# Patient Record
Sex: Female | Born: 1965 | Race: Black or African American | Hispanic: No | Marital: Single | Smoking: Never smoker
Health system: Southern US, Community
[De-identification: ages and names within clinical notes are randomized; demographics above are authoritative.]

## PROBLEM LIST (undated history)

## (undated) DIAGNOSIS — E785 Hyperlipidemia, unspecified: Secondary | ICD-10-CM

## (undated) DIAGNOSIS — I10 Essential (primary) hypertension: Secondary | ICD-10-CM

## (undated) DIAGNOSIS — M1990 Unspecified osteoarthritis, unspecified site: Secondary | ICD-10-CM

## (undated) HISTORY — DX: Hyperlipidemia, unspecified: E78.5

## (undated) HISTORY — DX: Unspecified osteoarthritis, unspecified site: M19.90

## (undated) HISTORY — DX: Essential (primary) hypertension: I10

---

## 2008-09-13 ENCOUNTER — Ambulatory Visit: Payer: Self-pay | Admitting: Family Medicine

## 2009-10-30 ENCOUNTER — Ambulatory Visit: Payer: Self-pay | Admitting: Family Medicine

## 2009-11-14 ENCOUNTER — Ambulatory Visit: Payer: Self-pay

## 2009-11-22 ENCOUNTER — Ambulatory Visit: Payer: Self-pay

## 2010-11-13 ENCOUNTER — Ambulatory Visit: Payer: Self-pay

## 2011-05-29 ENCOUNTER — Emergency Department: Payer: Self-pay

## 2011-05-29 LAB — PREGNANCY, URINE: Pregnancy Test, Urine: NEGATIVE m[IU]/mL

## 2011-05-29 LAB — URINALYSIS, COMPLETE
Bacteria: NONE SEEN
Bilirubin,UR: NEGATIVE
Blood: NEGATIVE
Glucose,UR: NEGATIVE mg/dL (ref 0–75)
Leukocyte Esterase: NEGATIVE
Nitrite: NEGATIVE
Protein: NEGATIVE
WBC UR: <1 /HPF (ref 0–5)

## 2012-01-20 ENCOUNTER — Ambulatory Visit: Payer: Self-pay | Admitting: Family Medicine

## 2013-04-12 ENCOUNTER — Ambulatory Visit: Payer: Self-pay

## 2013-05-13 HISTORY — PX: ABDOMINAL HYSTERECTOMY: SHX81

## 2014-05-25 ENCOUNTER — Ambulatory Visit: Payer: Self-pay

## 2014-06-06 ENCOUNTER — Ambulatory Visit: Payer: Self-pay

## 2014-06-08 ENCOUNTER — Encounter: Payer: Self-pay | Admitting: *Deleted

## 2014-06-20 ENCOUNTER — Encounter: Payer: Self-pay | Admitting: General Surgery

## 2014-06-20 ENCOUNTER — Ambulatory Visit (INDEPENDENT_AMBULATORY_CARE_PROVIDER_SITE_OTHER): Payer: PRIVATE HEALTH INSURANCE | Admitting: General Surgery

## 2014-06-20 VITALS — BP 120/80 | HR 78 | Resp 14 | Ht 61.0 in | Wt 151.0 lb

## 2014-06-20 DIAGNOSIS — N644 Mastodynia: Secondary | ICD-10-CM

## 2014-06-20 NOTE — Patient Instructions (Addendum)
Continue self breast exams. Call office for any new breast issues or concerns. Use ibuprofen twice a day for 10 days. Use heating pad 15 minutes twice a day. May try a sleep bra for comfort.

## 2014-06-20 NOTE — Progress Notes (Signed)
Patient ID: Beth Poole, female   DOB: 12/05/1965, 49 y.o.   MRN: [id number]  Chief Complaint  Patient presents with  . Follow-up    mammogram     HPI Beth Poole is a 49 y.o. female her for abnormal exam of breast. She was found to have thickening in the left breast upon exam. Her last mammogram was 06/06/14 [facility]. She does report some tenderness in the left breast that comes and goes in the last 1-2 months. She reports no other problems with her breasts.  HPI  Past Medical History  Diagnosis Date  . Arthritis   . Hyperlipidemia   . Hypertension     Past Surgical History  Procedure Laterality Date  . Abdominal hysterectomy  05/13/2013    still has ovaries    No family history on file.  Social History History  Substance Use Topics  . Smoking status: Never Smoker   . Smokeless tobacco: Never Used  . Alcohol Use: No    No Known Allergies  Current Outpatient Prescriptions  Medication Sig Dispense Refill  . hydrochlorothiazide (HYDRODIURIL) 25 MG tablet Take 25 mg by mouth daily.    Marland Kitchen. ibuprofen (ADVIL,MOTRIN) 800 MG tablet Take 800 mg by mouth every 8 (eight) hours as needed.     No current facility-administered medications for this visit.    Review of Systems Review of Systems  Constitutional: Negative.   Respiratory: Negative.   Cardiovascular: Negative.     Blood pressure 120/80, pulse 78, resp. rate 14, height 5\' 1"  (1.549 m), weight 151 lb (68.493 kg).  Physical Exam Physical Exam  Constitutional: She is oriented to person, place, and time. She appears well-developed and well-nourished.  Eyes: Conjunctivae are normal. No scleral icterus.  Neck: Neck supple.  Cardiovascular: Normal rate, regular rhythm and normal heart sounds.   Pulmonary/Chest: Effort normal and breath sounds normal. Right breast exhibits no inverted nipple, no mass, no nipple discharge, no skin change and no tenderness. Left breast exhibits tenderness (upper outer quadrant). Left breast  exhibits no inverted nipple, no mass, no nipple discharge and no skin change.    Lymphadenopathy:    She has no cervical adenopathy.    She has no axillary adenopathy.  Neurological: She is alert and oriented to person, place, and time.    Data Reviewed Bilateral mammograms dated 06/06/2014 were reviewed. Dense breasts as previously noted. Negative ultrasound. BI-RADS-1.  Assessment    Benign breast exam. Focal mastalgia at the axillary tail, possibly within the underlying pectoralis muscle.    Plan    The patient has made use of Motrin with good relief in the past. She was encouraged to make use of 800 mg twice a day for the next 10 days and make use of local heat to the area which should resolve her discomfort. Reassure the clinical exam, mammograms and ultrasounds are all in good order and there is no suspicion for occult malignancy.    PCP: Dr. Darreld McleanLinda Miles Ref: Corky CraftsSheena Lambert  Graciela Plato W 06/21/2014, 4:35 PM

## 2014-06-21 DIAGNOSIS — N644 Mastodynia: Secondary | ICD-10-CM | POA: Insufficient documentation

## 2014-08-01 ENCOUNTER — Encounter: Payer: Self-pay | Admitting: General Surgery

## 2015-06-21 ENCOUNTER — Ambulatory Visit: Payer: Self-pay | Attending: Oncology

## 2015-06-21 ENCOUNTER — Ambulatory Visit
Admission: RE | Admit: 2015-06-21 | Discharge: 2015-06-21 | Disposition: A | Discharge status: Home / Self Care | Payer: Self-pay | Source: Ambulatory Visit | Attending: Oncology | Admitting: Oncology

## 2015-06-21 VITALS — BP 122/79 | HR 80 | Temp 98.1°F | Resp 16 | Ht 62.21 in | Wt 144.6 lb

## 2015-06-21 DIAGNOSIS — Z0000 Encounter for general adult medical examination without abnormal findings: Secondary | ICD-10-CM

## 2015-06-21 NOTE — Progress Notes (Signed)
Subjective:     Patient ID: Beth Poole, female   DOB: 03/22/1965, 50 y.o.   MRN: [id number]  HPI   Review of Systems     Objective:   Physical Exam  Pulmonary/Chest: Right breast exhibits no inverted nipple, no mass, no nipple discharge, no skin change and no tenderness. Left breast exhibits no inverted nipple, no mass, no nipple discharge, no skin change and no tenderness. Breasts are symmetrical.  Genitourinary: No labial fusion. There is no rash, tenderness, lesion or injury on the right labia. There is no rash, tenderness, lesion or injury on the left labia. Right adnexum displays no mass, no tenderness and no fullness. Left adnexum displays no mass, no tenderness and no fullness. No erythema, tenderness or bleeding in the vagina. No foreign body around the vagina. No signs of injury around the vagina. No vaginal discharge found.  Partial hysterectomy.  Per patient ovaries intact.       Assessment:     50 year old patient presents for [facility] clinic visitPatient screened, and meets [facility] eligibility.  Patient does not have insurance, Medicare or Medicaid.  Handout given on Affordable Care Act. Instructed patient on breast self-exam using teach back method.  CBE unremarkable .  No mass or lump palpated.  Patient had a prtial hysterectomy in 2015.  Normal pap prior to surgery.  Pelvic exam normal.  No cervix visulized or palpated.    Plan:     Sent for bilateral screening mammogram.

## 2015-07-12 NOTE — Progress Notes (Unsigned)
Letter mailed from [facility] to notify of normal mammogram results.  Patient to return in one year for annual screening.  Copy to [facility]. 

## 2016-09-04 ENCOUNTER — Ambulatory Visit: Payer: Self-pay

## 2016-09-30 ENCOUNTER — Encounter (INDEPENDENT_AMBULATORY_CARE_PROVIDER_SITE_OTHER): Payer: Self-pay

## 2016-09-30 ENCOUNTER — Ambulatory Visit: Payer: Self-pay | Attending: Oncology

## 2016-09-30 ENCOUNTER — Ambulatory Visit
Admission: RE | Admit: 2016-09-30 | Discharge: 2016-09-30 | Disposition: A | Discharge status: Home / Self Care | Payer: Self-pay | Source: Ambulatory Visit | Attending: Oncology | Admitting: Oncology

## 2016-09-30 ENCOUNTER — Ambulatory Visit: Payer: Self-pay

## 2016-09-30 VITALS — BP 133/88 | HR 72 | Temp 98.5°F | Ht 61.0 in | Wt 156.0 lb

## 2016-09-30 DIAGNOSIS — Z0000 Encounter for general adult medical examination without abnormal findings: Secondary | ICD-10-CM

## 2016-09-30 NOTE — Progress Notes (Signed)
Subjective:     Patient ID: Georgina QuintGail R Sanger, female   DOB: 12/27/1965, 51 y.o.   MRN: [id number]  HPI   Review of Systems     Objective:   Physical Exam  Pulmonary/Chest: Right breast exhibits no inverted nipple, no mass, no nipple discharge, no skin change and no tenderness. Left breast exhibits no inverted nipple, no mass, no nipple discharge, no skin change and no tenderness. Breasts are symmetrical.       Assessment:     51 [facility].  Patient screened, and meets [facility] eligibility.  Patient does not have insurance, Medicare or Medicaid.  Handout given on Affordable Care Act. Patient works "As a Interior and spatial designerhairdresser near the [facility] Instructed patient on breast self-exam using teach back method.  CBE unremarkable.  No mass or lump palpated.  Reports she forgot to take her blood pressure medicine this morning, but verbalizes understanding, and rational for taking as prescribed at the same time daily.     Plan:     Sent for bilateral screening mammogram.

## 2016-11-27 NOTE — Progress Notes (Unsigned)
Letter mailed from [facility] to notify of normal mammogram results.  Patient to return in one year for annual screening.  Copy to [facility]. 

## 2017-10-01 ENCOUNTER — Encounter: Payer: Self-pay | Admitting: *Deleted

## 2017-10-01 ENCOUNTER — Ambulatory Visit: Payer: Self-pay | Attending: Oncology | Admitting: *Deleted

## 2017-10-01 ENCOUNTER — Ambulatory Visit
Admission: RE | Admit: 2017-10-01 | Discharge: 2017-10-01 | Disposition: A | Discharge status: Home / Self Care | Payer: Self-pay | Source: Ambulatory Visit | Attending: Oncology | Admitting: Oncology

## 2017-10-01 ENCOUNTER — Other Ambulatory Visit: Payer: Self-pay

## 2017-10-01 VITALS — BP 123/85 | HR 85 | Temp 96.0°F | Ht 62.0 in | Wt 156.0 lb

## 2017-10-01 DIAGNOSIS — Z0000 Encounter for general adult medical examination without abnormal findings: Secondary | ICD-10-CM

## 2017-10-01 NOTE — Progress Notes (Signed)
Letter mailed from [facility] to inform patient of her normal mammogram results.  Patient is to follow-up with annual screening in one year.  [facility] to [place]y. 

## 2017-10-01 NOTE — Progress Notes (Signed)
  Subjective:     Patient ID: Beth Poole, female   DOB: 10/05/1965, 52 y.o.   MRN: [id number]  HPI   Review of Systems     Objective:   Physical Exam  Pulmonary/Chest: Right breast exhibits no inverted nipple, no mass, no nipple discharge, no skin change and no tenderness. Left breast exhibits no inverted nipple, no mass, no nipple discharge, no skin change and no tenderness.       Assessment:     52 year old Black [facility]  Clinical breast exam unremarkable.  Taught self breast awareness.  Patient had a hysterectomy in 2015 for heavy bleeding.  No pap required per protocol.  Patient has been screened for eligibility.  She does not have any insurance, Medicare or Medicaid.  She also meets financial eligibility.  Hand-out given on the Affordable Care Act.    Plan:     Screening mammogram ordered.  Will follow-up per [facility] protocol.

## 2017-10-01 NOTE — Patient Instructions (Signed)
Gave patient hand-out, Women Staying Healthy, Active and Well from [facility], with education on breast health, pap smears, heart and colon health. 

## 2018-10-05 ENCOUNTER — Other Ambulatory Visit: Payer: Self-pay

## 2018-10-05 DIAGNOSIS — Z0000 Encounter for general adult medical examination without abnormal findings: Secondary | ICD-10-CM

## 2018-10-05 NOTE — Progress Notes (Signed)
Phoned patient for 10/07/2018 mammogram. Patient screened, and meets [facility] eligibility.  Patient does not have insurance, Medicare or Medicaid.  Verbal Consent given.  Patient denies any breast problems.  Orders placed for bilateral screening mammogram.  Patient to arrive at 10:30.

## 2018-10-07 ENCOUNTER — Ambulatory Visit
Admission: RE | Admit: 2018-10-07 | Discharge: 2018-10-07 | Disposition: A | Discharge status: Home / Self Care | Payer: Self-pay | Source: Ambulatory Visit | Attending: Oncology | Admitting: Oncology

## 2018-10-07 ENCOUNTER — Ambulatory Visit: Payer: Self-pay | Attending: Oncology

## 2018-10-07 DIAGNOSIS — Z0000 Encounter for general adult medical examination without abnormal findings: Secondary | ICD-10-CM | POA: Insufficient documentation

## 2018-11-17 NOTE — Progress Notes (Signed)
Letter mailed from [facility] to notify of normal mammogram results.  Patient to return in one year for annual screening.  Copy to [facility].   Risk Assessment    No risk assessment data for the current encounter   Risk Scores      10/05/2018   Last edited by: Theodore Demark, RN   5-year risk: 1.3 %   Lifetime risk: 8.2 %

## 2019-03-15 ENCOUNTER — Ambulatory Visit: Payer: HRSA Program | Attending: Internal Medicine

## 2019-03-15 DIAGNOSIS — Z20822 Contact with and (suspected) exposure to covid-19: Secondary | ICD-10-CM

## 2019-03-16 LAB — NOVEL CORONAVIRUS, NAA: SARS-CoV-2, NAA: NOT DETECTED

## 2019-10-12 ENCOUNTER — Ambulatory Visit
Admission: RE | Admit: 2019-10-12 | Discharge: 2019-10-12 | Disposition: A | Discharge status: Home / Self Care | Payer: Self-pay | Source: Ambulatory Visit | Attending: Oncology | Admitting: Oncology

## 2019-10-12 ENCOUNTER — Ambulatory Visit: Payer: Self-pay | Attending: Oncology | Admitting: *Deleted

## 2019-10-12 ENCOUNTER — Encounter: Payer: Self-pay | Admitting: *Deleted

## 2019-10-12 ENCOUNTER — Other Ambulatory Visit: Payer: Self-pay

## 2019-10-12 VITALS — BP 150/94 | HR 79 | Temp 97.8°F | Resp 20 | Ht 61.0 in | Wt 154.0 lb

## 2019-10-12 DIAGNOSIS — Z0000 Encounter for general adult medical examination without abnormal findings: Secondary | ICD-10-CM | POA: Insufficient documentation

## 2019-10-12 NOTE — Progress Notes (Signed)
  Subjective:     Patient ID: Beth Poole, female   DOB: 1965/10/20, 54 y.o.   MRN: [id number]  HPI   [facility] Medical History Record - 10/12/19 1052      Breast History   Screening cycle Rescreen    CBE Date 10/12/19    Provider (CBE) [facility]    Last Mammogram Annual    Last Mammogram Date 10/07/18    Recent Breast Symptoms None      Breast Cancer History   Breast Cancer History No personal or family history      Previous History of Breast Problems   Breast Surgery or Biopsy None    Breast Implants N/A    BSE Done Monthly      Gynecological/Obstetrical History   Is there any chance that the client could be pregnant?  No    Breast fed children No    DES Exposure No    Cervical, Uterine or Ovarian cancer No    Family history of Cervial, Uterine or Ovarian cancer No    Hysterectomy Yes    Cervix removed Yes    Ovaries removed Yes    Laser/Cryosurgery No    Current method of birth control None    Current method of Estrogen/Hormone replacement None    Smoking history None            Review of Systems     Objective:   Physical Exam Chest:     Breasts:        Right: No swelling, bleeding, inverted nipple, mass, nipple discharge, skin change or tenderness.        Left: No swelling, bleeding, inverted nipple, mass, nipple discharge, skin change or tenderness.  Lymphadenopathy:     Upper Body:     Right upper body: No supraclavicular or axillary adenopathy.     Left upper body: No supraclavicular or axillary adenopathy.        Assessment:     54 year old Black female returns to [facility] for annual screening.  Clinical breast exam unremarkable.  Taught self breast awareness.  Patient with a history of hysterectomy for heavy bleeding.  Pap omitted per protocol.  Patient has been screened for eligibility.  She does not have any insurance, Medicare or Medicaid.  She also meets financial eligibility.   Risk Assessment    Risk Scores      10/12/2019 10/05/2018   Last edited by:  [name], CMA Beth Presto, RN   5-year risk: 1.3 % 1.3 %   Lifetime risk: 8 % 8.2 %            Plan:     Screening mammogram ordered.  Will follow up per [facility] protocol.

## 2019-10-12 NOTE — Patient Instructions (Signed)
Gave patient hand-out, Women Staying Healthy, Active and Well from [facility], with education on breast health, pap smears, heart and colon health. 

## 2019-10-14 ENCOUNTER — Encounter: Payer: Self-pay | Admitting: *Deleted

## 2019-10-14 NOTE — Progress Notes (Signed)
Letter mailed from [facility] to inform patient of her normal mammogram results.  Patient is to follow-up with annual screening in one year. 

## 2020-10-17 ENCOUNTER — Other Ambulatory Visit: Payer: Self-pay

## 2020-10-17 ENCOUNTER — Ambulatory Visit
Admission: RE | Admit: 2020-10-17 | Discharge: 2020-10-17 | Disposition: A | Discharge status: Home / Self Care | Payer: Self-pay | Source: Ambulatory Visit | Attending: Oncology | Admitting: Oncology

## 2020-10-17 ENCOUNTER — Ambulatory Visit: Payer: Self-pay | Attending: Oncology | Admitting: *Deleted

## 2020-10-17 VITALS — BP 119/90 | HR 95 | Temp 97.9°F | Ht 61.85 in | Wt 152.6 lb

## 2020-10-17 DIAGNOSIS — Z0000 Encounter for general adult medical examination without abnormal findings: Secondary | ICD-10-CM | POA: Insufficient documentation

## 2020-10-17 NOTE — Progress Notes (Signed)
  Subjective:     Patient ID: Beth Poole, female   DOB: 19-Jul-1965, 55 y.o.   MRN: [id number]  HPI  [facility] Medical History Record - 10/17/20 1014       Breast History   Screening cycle Rescreen    CBE Date 10/12/19    Provider (CBE) [facility]    Initial Mammogram 10/17/20    Last Mammogram Annual    Last Mammogram Date 10/12/19    Provider (Mammogram)  Delford Field    Recent Breast Symptoms None      Breast Cancer History   Breast Cancer History No personal or family history      Previous History of Breast Problems   Breast Surgery or Biopsy Left    Breast Implants N/A    BSE Done Monthly      Gynecological/Obstetrical History   LMP --   hysterectomy at age 23 or 76   Is there any chance that the client could be pregnant?  No    Age at menarche 63    Age at menopause NA    Age at first live birth 43    Breast fed children No    DES Exposure Unkown    Cervical, Uterine or Ovarian cancer No    Family history of Cervial, Uterine or Ovarian cancer No    Hysterectomy Yes   for fibroids at age 66 or 89   Cervix removed Yes    Ovaries removed No    Laser/Cryosurgery No    Current method of birth control None    Current method of Estrogen/Hormone replacement None    Smoking history None               Review of Systems     Objective:   Physical Exam Chest:  Breasts:    Right: No swelling, bleeding, inverted nipple, mass, nipple discharge, skin change, tenderness, axillary adenopathy or supraclavicular adenopathy.     Left: No swelling, bleeding, inverted nipple, mass, nipple discharge, skin change, tenderness, axillary adenopathy or supraclavicular adenopathy.  Lymphadenopathy:     Upper Body:     Right upper body: No supraclavicular or axillary adenopathy.     Left upper body: No supraclavicular or axillary adenopathy.      Assessment:     55 year old female returns to [facility] for annual screening.  Clinical breast exam is unremarkable.  Taught self breast awareness.   Patient with a history of hysterectomy.  Pap omitted per protocol.  Patient has been screened for eligibility.  She does not have any insurance, Medicare or Medicaid.  She also meets financial eligibility.   Risk Assessment     Risk Scores       10/17/2020 10/12/2019   Last edited by: Jim Like, RN [name], CMA   5-year risk: 1.4 % 1.3 %   Lifetime risk: 7.8 % 8 %               Plan:     Screening mammogram ordered.  Will follow up per [facility] protocol.

## 2020-10-18 ENCOUNTER — Encounter: Payer: Self-pay | Admitting: *Deleted

## 2023-02-04 IMAGING — MG MM DIGITAL SCREENING BILAT W/ TOMO AND CAD
8 series · 8 of 24 positions shown · non-contrast
Comparison: Previous exam(s).

CLINICAL DATA: Screening.

EXAM:
DIGITAL SCREENING BILATERAL MAMMOGRAM WITH TOMOSYNTHESIS AND CAD
TECHNIQUE: Bilateral screening digital craniocaudal and mediolateral oblique
mammograms were obtained. Bilateral screening digital breast
tomosynthesis was performed. The images were evaluated with
computer-aided detection.

[R MLO synth-2D]
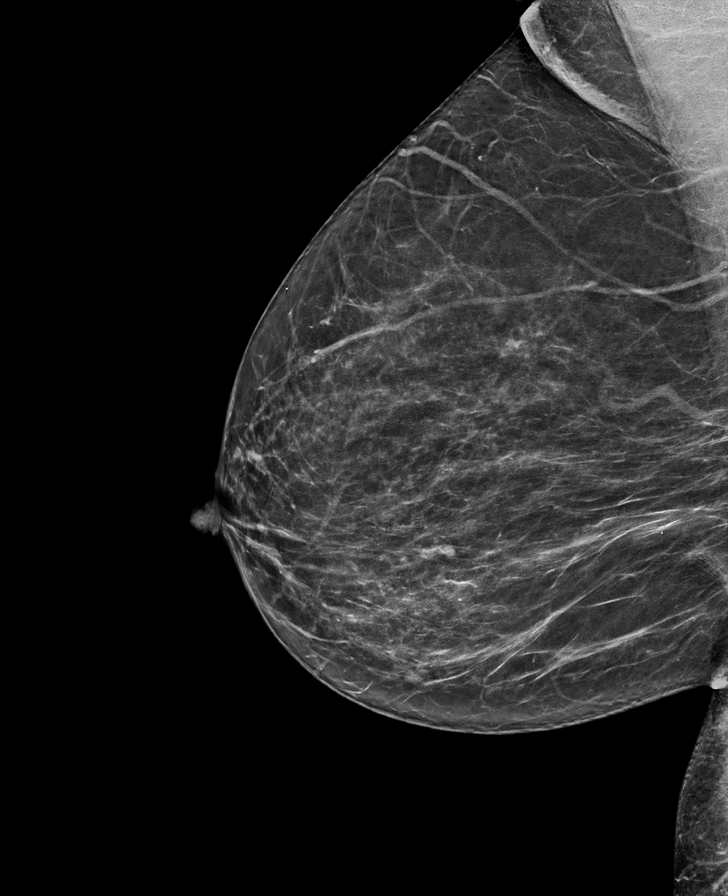

[L CC synth-2D]
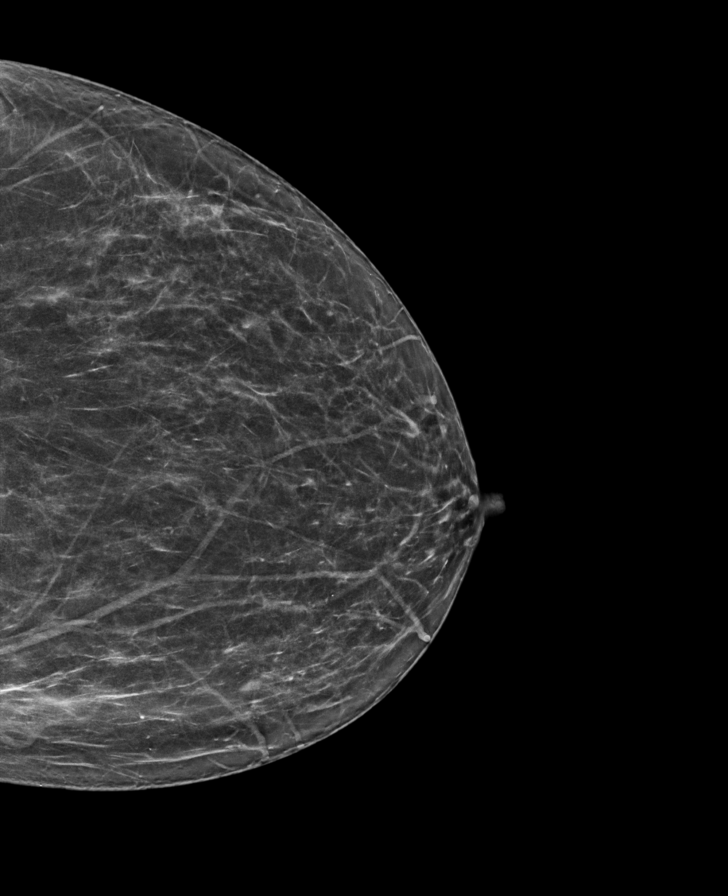

[R CC synth-2D]
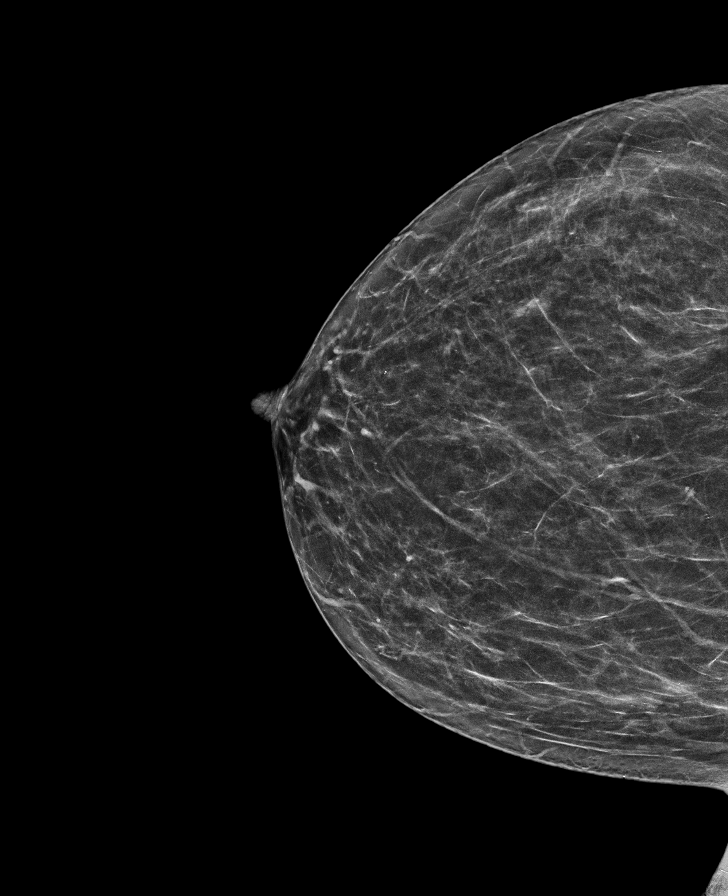

[L MLO synth-2D]
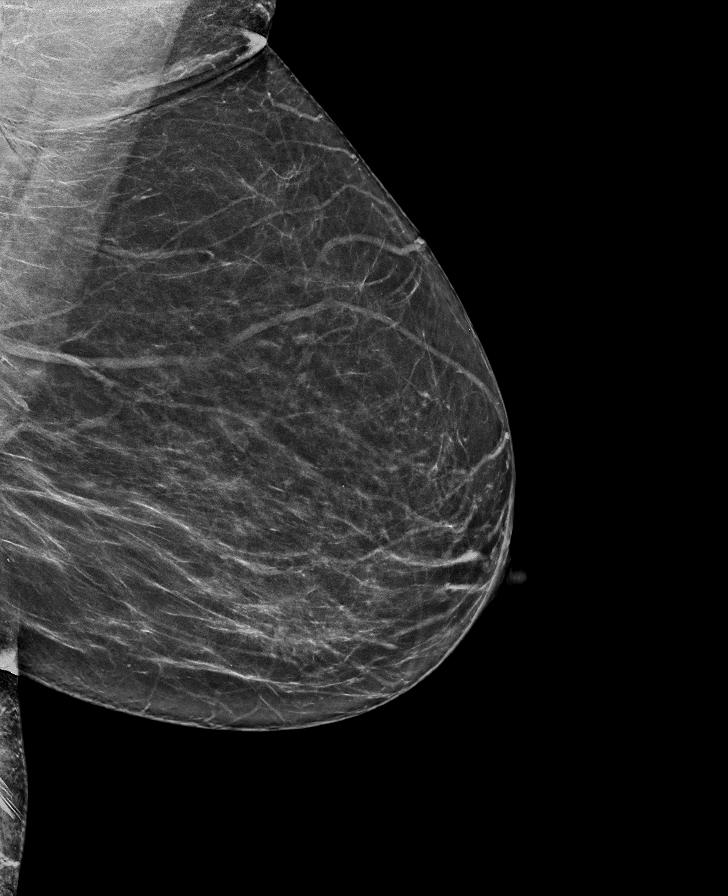

[R CC tomo · tomo slice 31/62.0]
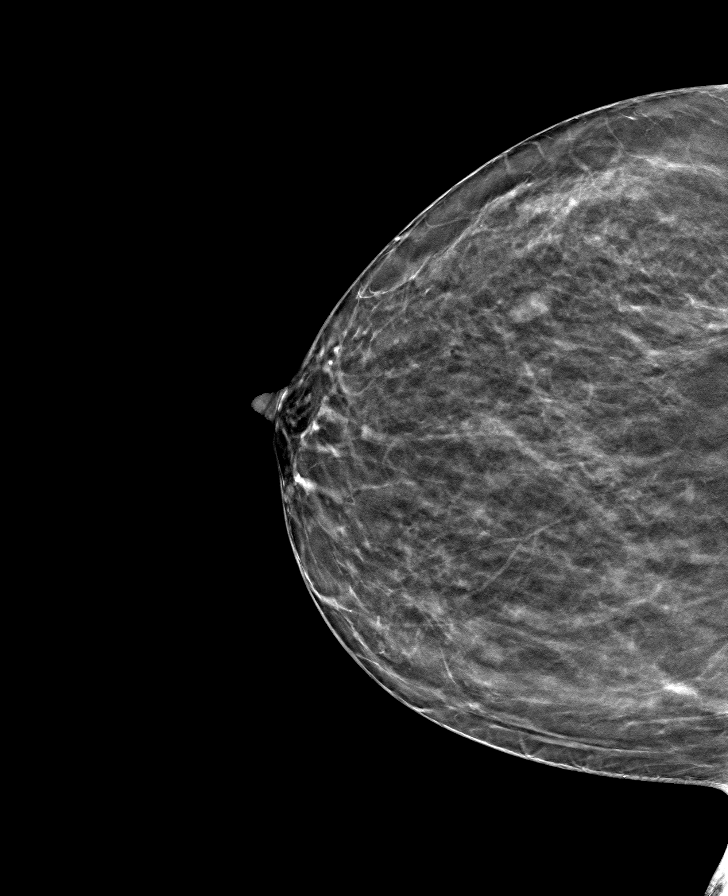

[R MLO tomo · tomo slice 35/69.0]
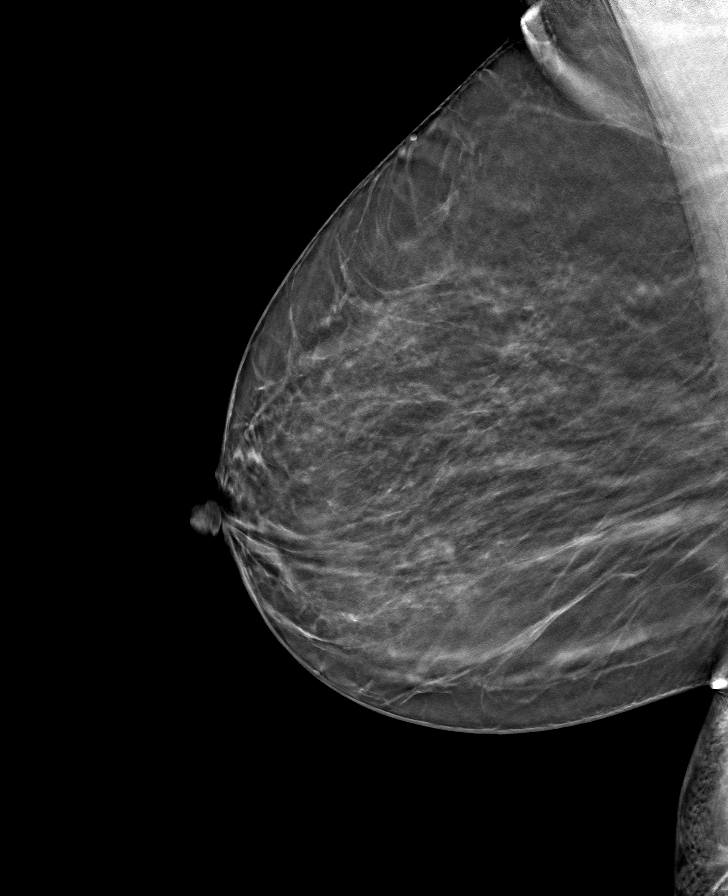

[L CC tomo · tomo slice 33/64.0]
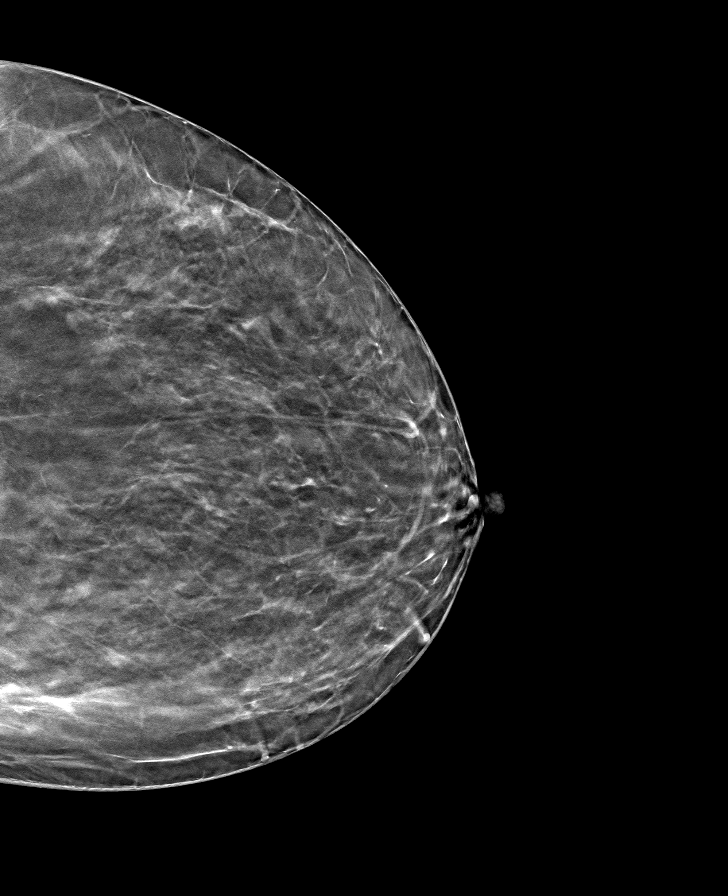

[L MLO tomo · tomo slice 37/72.0]
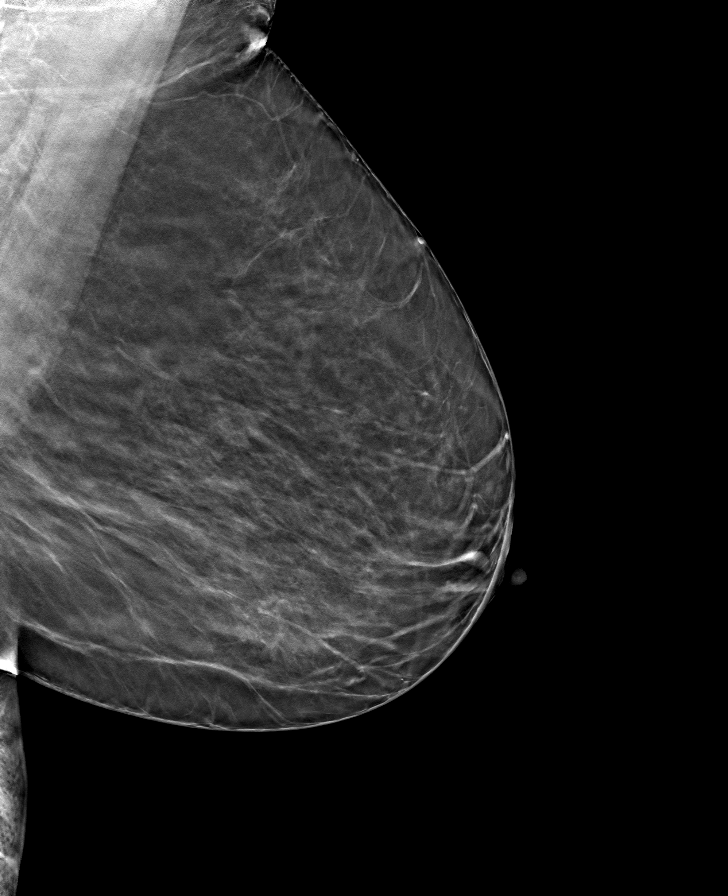

[8 of 24 positions shown; findings below may reference images not displayed]

ACR Breast Density Category b: There are scattered areas of
fibroglandular density.
FINDINGS: There are no findings suspicious for malignancy.
IMPRESSION: No mammographic evidence of malignancy. A result letter of this
screening mammogram will be mailed directly to the patient.

RECOMMENDATION:
Screening mammogram in one year. (Code:51-O-LD2)

BI-RADS CATEGORY  1: Negative.

## 2023-06-25 ENCOUNTER — Telehealth: Payer: Self-pay | Admitting: *Deleted

## 2023-08-19 ENCOUNTER — Other Ambulatory Visit: Payer: Self-pay | Admitting: Family Medicine

## 2023-08-19 DIAGNOSIS — Z1231 Encounter for screening mammogram for malignant neoplasm of breast: Secondary | ICD-10-CM

## 2023-10-27 ENCOUNTER — Ambulatory Visit
Admission: RE | Admit: 2023-10-27 | Discharge: 2023-10-27 | Disposition: A | Discharge status: Home / Self Care | Payer: Self-pay | Source: Ambulatory Visit | Attending: Family Medicine | Admitting: Family Medicine

## 2023-10-27 DIAGNOSIS — Z1231 Encounter for screening mammogram for malignant neoplasm of breast: Secondary | ICD-10-CM | POA: Insufficient documentation
